# Patient Record
Sex: Male | Born: 1960
Health system: Southern US, Community
[De-identification: ages and names within clinical notes are randomized; demographics above are authoritative.]

---

## 2011-07-24 LAB — COMPREHENSIVE METABOLIC PANEL
Albumin: 3.8 g/dL (ref 3.4–5.0)
Alkaline Phosphatase: 77 U/L (ref 50–136)
Anion Gap: 11 (ref 7–16)
BUN: 10 mg/dL (ref 7–18)
Bilirubin,Total: 0.5 mg/dL (ref 0.2–1.0)
Co2: 27 mmol/L (ref 21–32)
Creatinine: 1.3 mg/dL (ref 0.60–1.30)
EGFR (African American): >60
EGFR (Non-African Amer.): >60
Osmolality: 278 (ref 275–301)
Potassium: 3.9 mmol/L (ref 3.5–5.1)
SGOT(AST): 29 U/L (ref 15–37)
SGPT (ALT): 24 U/L
Total Protein: 7.3 g/dL (ref 6.4–8.2)

## 2011-07-24 LAB — CBC
HCT: 44.3 % (ref 40.0–52.0)
HGB: 15.4 g/dL (ref 13.0–18.0)
MCH: 32.8 pg (ref 26.0–34.0)
MCHC: 34.7 g/dL (ref 32.0–36.0)
MCV: 94 fL (ref 80–100)
Platelet: 183 10*3/uL (ref 150–440)
RBC: 4.69 10*6/uL (ref 4.40–5.90)
RDW: 13.1 % (ref 11.5–14.5)
WBC: 9.8 10*3/uL (ref 3.8–10.6)

## 2011-07-24 LAB — SALICYLATE LEVEL: Salicylates, Serum: 3.8 mg/dL — ABNORMAL HIGH

## 2011-07-24 LAB — DRUG SCREEN, URINE
Amphetamines, Ur Screen: NEGATIVE (ref ?–1000)
Barbiturates, Ur Screen: NEGATIVE (ref ?–200)
Cocaine Metabolite,Ur ~~LOC~~: NEGATIVE (ref ?–300)
MDMA (Ecstasy)Ur Screen: NEGATIVE (ref ?–500)
Methadone, Ur Screen: NEGATIVE (ref ?–300)
Opiate, Ur Screen: NEGATIVE (ref ?–300)
Phencyclidine (PCP) Ur S: NEGATIVE (ref ?–25)

## 2011-07-24 LAB — ACETAMINOPHEN LEVEL: Acetaminophen: <2 ug/mL

## 2011-07-24 LAB — TSH: Thyroid Stimulating Horm: 1.12 u[IU]/mL

## 2011-07-24 LAB — ETHANOL
Ethanol %: <0.003 % (ref 0.000–0.080)
Ethanol: <3 mg/dL

## 2011-07-25 ENCOUNTER — Inpatient Hospital Stay: Payer: Self-pay | Admitting: Internal Medicine

## 2011-07-25 LAB — URINALYSIS, COMPLETE
Bacteria: NONE SEEN
Bilirubin,UR: NEGATIVE
Leukocyte Esterase: NEGATIVE
Nitrite: NEGATIVE
WBC UR: NONE SEEN /HPF (ref 0–5)

## 2011-07-26 ENCOUNTER — Inpatient Hospital Stay: Payer: Self-pay | Admitting: Psychiatry

## 2011-07-26 LAB — CBC WITH DIFFERENTIAL/PLATELET
Eosinophil #: 0.3 10*3/uL (ref 0.0–0.7)
Eosinophil %: 5 %
HGB: 14 g/dL (ref 13.0–18.0)
Lymphocyte #: 1.8 10*3/uL (ref 1.0–3.6)
Lymphocyte %: 29 %
MCHC: 33.7 g/dL (ref 32.0–36.0)
MCV: 96 fL (ref 80–100)
Monocyte #: 0.6 10*3/uL (ref 0.0–0.7)
Neutrophil %: 54.9 %
Platelet: 170 10*3/uL (ref 150–440)
RBC: 4.33 10*6/uL — ABNORMAL LOW (ref 4.40–5.90)
RDW: 13.4 % (ref 11.5–14.5)
WBC: 6.1 10*3/uL (ref 3.8–10.6)

## 2011-07-26 LAB — MAGNESIUM: Magnesium: 1.9 mg/dL

## 2011-07-26 LAB — BASIC METABOLIC PANEL
Anion Gap: 8 (ref 7–16)
Calcium, Total: 8.7 mg/dL (ref 8.5–10.1)
Co2: 25 mmol/L (ref 21–32)
Creatinine: 1.08 mg/dL (ref 0.60–1.30)
EGFR (African American): >60
EGFR (Non-African Amer.): >60
Glucose: 99 mg/dL (ref 65–99)
Osmolality: 280 (ref 275–301)
Sodium: 141 mmol/L (ref 136–145)

## 2011-07-26 LAB — LIPID PANEL
Cholesterol: 167 mg/dL (ref 0–200)
Ldl Cholesterol, Calc: 110 mg/dL — ABNORMAL HIGH (ref 0–100)
Triglycerides: 96 mg/dL (ref 0–200)

## 2011-07-31 LAB — COMPREHENSIVE METABOLIC PANEL
Bilirubin,Total: 0.6 mg/dL (ref 0.2–1.0)
Calcium, Total: 8.9 mg/dL (ref 8.5–10.1)
Co2: 31 mmol/L (ref 21–32)
Creatinine: 1.11 mg/dL (ref 0.60–1.30)
EGFR (African American): >60
Glucose: 115 mg/dL — ABNORMAL HIGH (ref 65–99)
SGOT(AST): 21 U/L (ref 15–37)
SGPT (ALT): 21 U/L
Total Protein: 6.8 g/dL (ref 6.4–8.2)

## 2011-07-31 LAB — VALPROIC ACID LEVEL: Valproic Acid: 56 ug/mL

## 2012-07-08 IMAGING — CR PELVIS - 1-2 VIEW
1 series · 2 of 2 positions shown · non-contrast
Comparison: none

REASON FOR EXAM: r/o needle sewing needle in scrotum.. Soft tissue or
center low to r/o FB
COMMENTS:   LMP: (Male)

[Series 1: ap · 0.17mm/px · 2 of 2 slices shown]
[im 1/2]
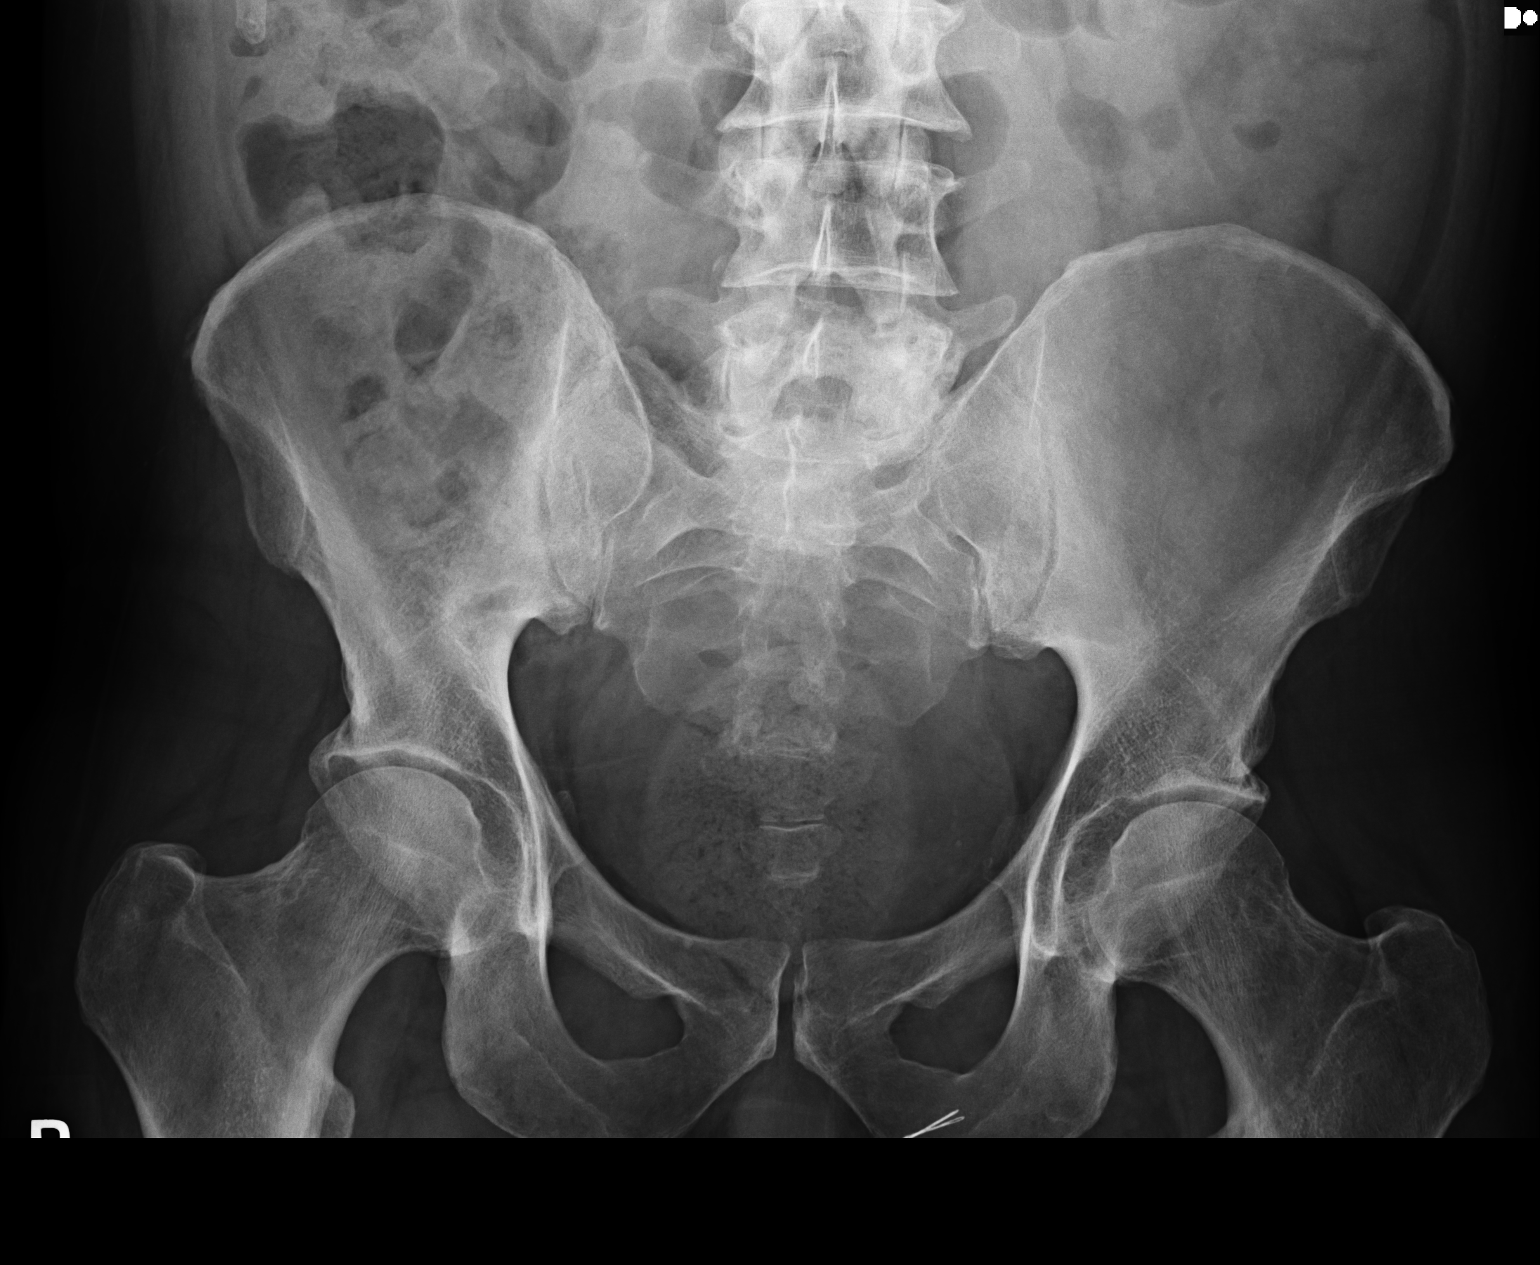
[im 2/2]
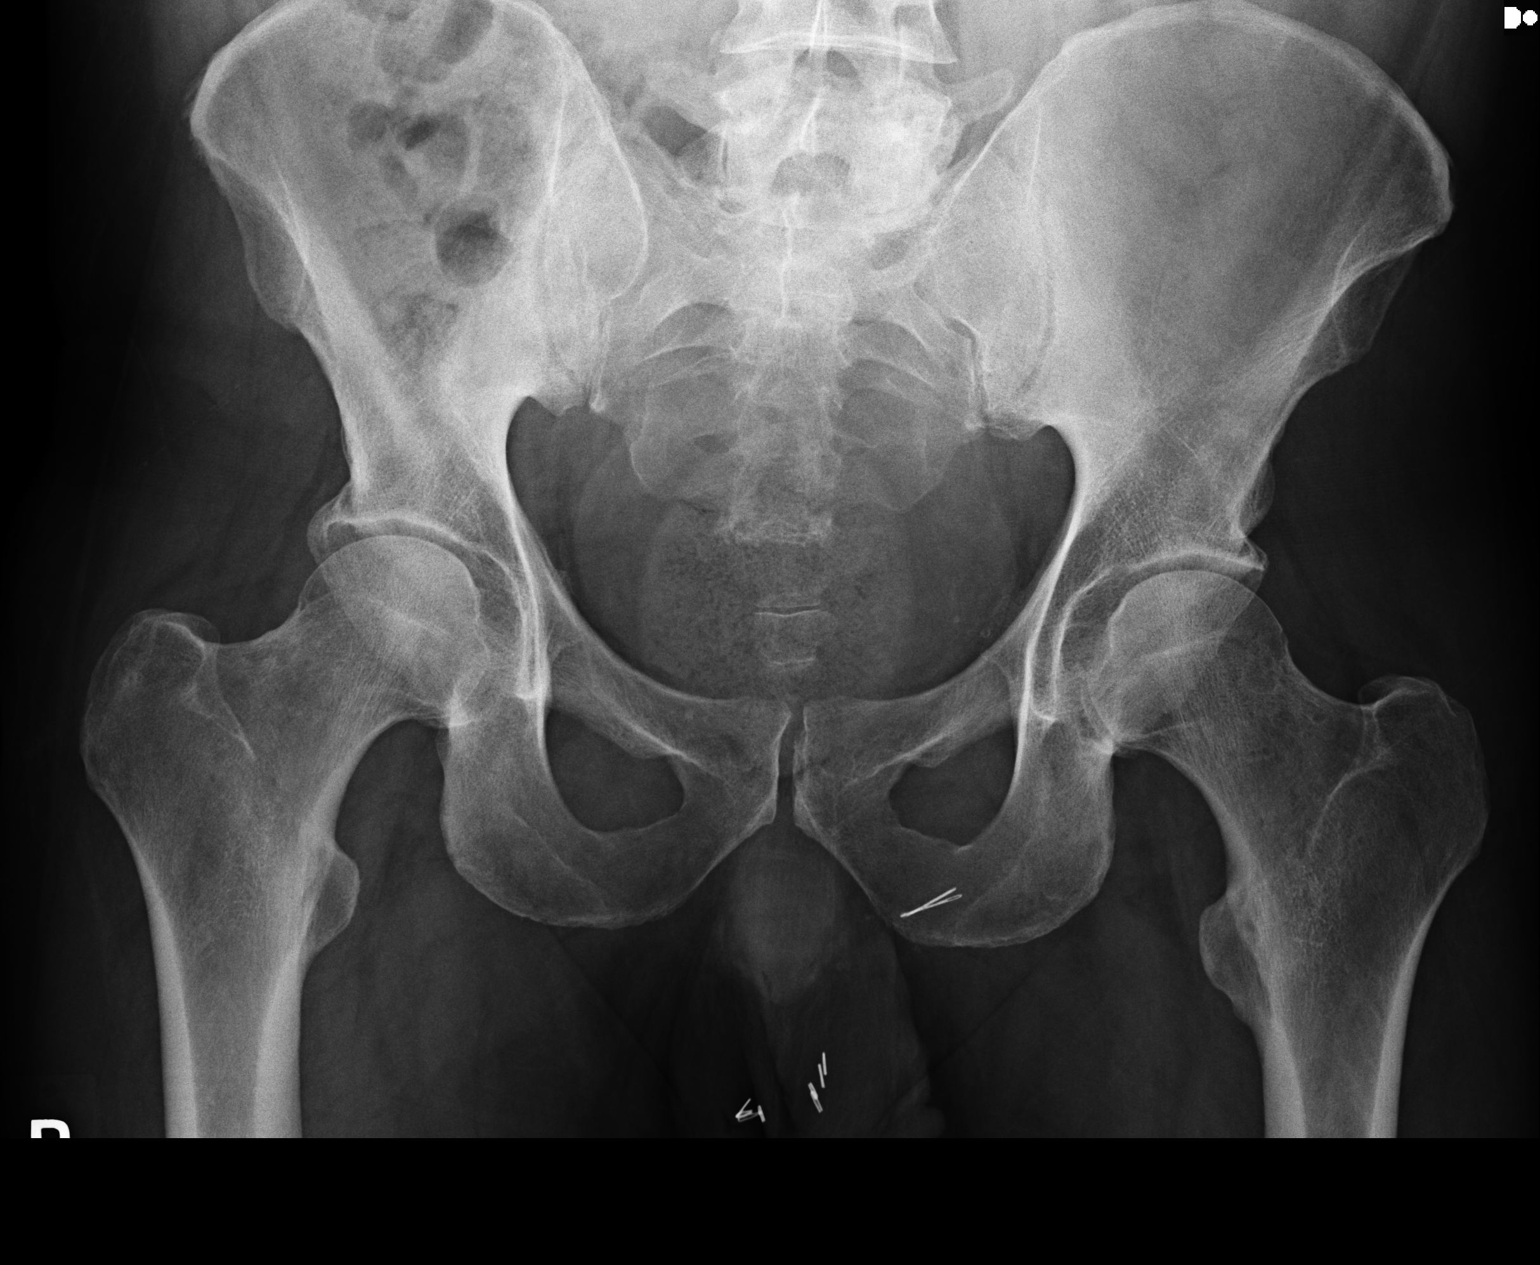

[2 of 2 positions shown; findings below may reference images not displayed]

PROCEDURE:     DXR - DXR PELVIS AP ONLY  - July 25, 2011  [DATE]

RESULT:     An AP view of the bony pelvis shows no fracture, dislocation or
other acute bony abnormality. There is what appears to be two needle
fragments or a single bent needle projected over the left ischium. Inferior
to the ischial rami, there are noted multiple linear metallic densities
consistent with multiple needle fragments projected over the scrotal region
with there being a least three on the left and two or three on the right.
IMPRESSION: 1.  There are multiple needle fragments observed in the soft tissues as
noted above.
2.  No acute bony abnormalities are seen.

## 2014-08-22 NOTE — H&P (Signed)
PATIENT NAME:  [name], [name] MR#:  [id number] DATE OF BIRTH:  1960/06/10  DATE OF ADMISSION:  07/25/2011  PRIMARY CARE PHYSICIAN: He states his name is Dr. Iran Sizer.   CHIEF COMPLAINT: Drug overdose.   HISTORY OF PRESENT ILLNESS: [name] is a 54 year old Caucasian male with a history of schizophrenia. He was arrested by the Police when they found him driving his car in an erratic way and suspected drug overdose. He was brought to the hospital. Here he admits that he tried to commit suicide by taking 5 tablets of fluphenazine. The patient does not state much about why he did that. He is groggy and sleepy, and it was difficult to obtain history. He has with him papers of involuntary commitment. The patient was admitted to the Intensive Care Unit for further evaluation and treatment. Contacting the [facility], they recommended checking his vital signs and to keep an eye on his EKG.   REVIEW OF SYSTEMS: Ten-point system review was difficult to obtain due to the patient's drowsiness, and he does not give adequate history; but generally he denies having any discomfort. No chest pain, no shortness of breath.  No abdominal pain. No headache.   PAST MEDICAL HISTORY:  1. History of schizophrenia.  2. He also gave vague statements about a history of heart problem for which he takes a pill, but he does not recall the name, in addition to one aspirin a day.   SOCIAL HABITS: Chronic smoker, 1-1/2  packs per day since the age of 45. He drinks alcohol only occasionally or socially, primarily beer. No other drug abuse.   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 He lives with his mother.   FAMILY HISTORY: Negative for mental illness.   ADMISSION MEDICATIONS:  1. Fluphenazine, the dose is not specified.  2. Aspirin 325 mg a day.  3. He reports also a heart pill that he does not verbalize or recall the name adequately.   ALLERGIES: No known drug allergies.    PHYSICAL EXAMINATION:  VITAL SIGNS: Blood pressure 99/58, respiratory rate 16, pulse 72, temperature 98.4, oxygen saturation 98%.   GENERAL APPEARANCE: Middle-aged male lying in bed, looks sleepy but arousable, in no acute distress.   HEENT: Head: No pallor. No icterus. No cyanosis. Ears, nose and throat: Hearing was normal. Nasal mucosa, lips, tongue were normal. Eyes: Examination revealed normal iris and conjunctivae, although the conjunctivae are slightly congested. Pupils are about 6 to 7 mm, equal and reactive to light.   NECK: Supple. Trachea at midline. No thyromegaly. No cervical lymphadenopathy. No masses.   HEART: Exam revealed normal S1, S2. No S3, S4. No murmur. No gallop. No carotid bruits.   RESPIRATORY: Exam revealed normal breathing pattern without use of accessory muscles. No rales. No wheezing.   ABDOMEN: Soft without tenderness. No hepatosplenomegaly. No masses. No hernias.   MUSCULOSKELETAL: No joint swelling. No clubbing.   SKIN: No ulcers. No subcutaneous nodules.   NEUROLOGICAL: Cranial nerves II through XII are intact. No focal motor deficit.   PSYCHIATRY: The patient is alert, oriented to place being the hospital in [place], but he could not recall the name. He is oriented to the date and the month. Mood and affect were flat.   LABORATORY, DIAGNOSTIC AND RADIOLOGICAL DATA:  His EKG showed normal sinus rhythm at a rate of 86 per minute. Normal EKG. Serum glucose 95, BUN 10, creatinine 1.3, sodium 140, potassium 3.9.  Alcohol less than 3, and  liver function tests were normal.  His TSH was normal at 1.1.  CBC was normal with white count of 9000, hemoglobin 15, hematocrit 44, platelet count 183. Acetaminophen less than 2.0  Salicylate was 3.8. This is still in the therapeutic range.  Urine drug screen was negative.   ASSESSMENT:  1. Drug overdose with fluphenazine. He took 5 tablets. 2. Suicide attempt, according to the  patient. 3. Schizophrenia. 4. History of nonspecified heart problem.   PLAN:  1. The patient was admitted to the Intensive Care Unit  with monitoring.  2. Repeat EKG in the morning to make sure there are no changes.  3. We will keep an eye on his vital signs.  4. Psychiatric evaluation and consult.   ____________________________ Carney CornersAmir M. Rudene Rearwish, MD amd:cbb D: 07/25/2011 02:40:02 ET T: 07/25/2011 08:12:48 ET JOB#: [id number]  cc: Carney CornersAmir M. Rudene Rearwish, MD, <Dictator> Dr. Lesia SagoNoble Shakevia Sarris M Randalyn Ahmed MD ELECTRONICALLY SIGNED 07/26/2011 0:18

## 2014-08-22 NOTE — Consult Note (Signed)
PATIENT NAME:  [name], Domani MR#:  [id number] DATE OF BIRTH:  Apr 11, 1961  DATE OF CONSULTATION:  07/25/2011  REFERRING PHYSICIAN:  Dr. Marlaine HindAmir Darwish  CONSULTING PHYSICIAN:  Doralee AlbinoAarti K. Maryruth BunKapur, MD  REASON FOR ADMISSION: Suicidal thoughts status post overdose on a combination of Lamictal and Prolixin.   IDENTIFYING INFORMATION: Mr. [name] is a 54 year old single Caucasian male currently working at [employer] 12 hours a week and living in [place] with his mother. He has never been married and has no children.   HISTORY OF PRESENT ILLNESS: Mr. [name] is a 54 year old single Caucasian male with a prior diagnosis of schizophrenia per the patient who was brought to the hospital by the police after he was arrested for driving erratically. The patient had told police that he took five Prolixin pills in a suicide attempt but then told this writer he had also overdosed on Lamictal. In addition, the patient said he had had a 6-pack of beer prior to taking the pills although ethanol level in the Emergency Room was negative and toxicology screen was negative for all substances. The patient says he was just discharged from [facility] a few weeks ago after having had a suicide attempt there by trying to tie a hose in his car. When asked why he was trying to commit suicide patient said, "I don't know." He says that he was driving from his home in [place] to [place]. When asked why he said, "I don't know." He then said he was driving back from [place] to [place]. The only stressor the patient could identify was that he is unhappy with his current living situation with his mother. He describes his mother as being a very negative person who puts him in a bad mood repeatedly. He says his mother is hurting him by the statement that she makes. He currently hates this living situation and says he is "tired of living". He denies any current active suicidal thoughts. The patient denies feeling depressed prior to the suicide  attempt and denies feelings of helplessness and hopelessness, anhedonia, decreased energy level, problems with insomnia or change in appetite. He says his appetite is good and the patient does not appear to be underweight. BMI is 24.4. The patient denies any illicit drug use. He says he only drinks alcohol, one 24-ounce beer every other day. He denies any history of any auditory or visual hallucinations. No paranoid thoughts but the patient does have some delusions. He believes that he is telepathic and that he can have sex with women without touching them. He also believes that he can communicate and talk with dogs and animals and believes that they talk back to him. He denies any ideas of reference from the TV or from music. He does state that he writes songs quite frequently but does not endorse any grandiose delusions, hyperreligious thoughts or periods of time with decreased sleep for several days at a time with increased goal-directed behavior. CT of the head in the Emergency Room was negative. Toxicology screen was negative for all substances and ethanol screen was less than 3. The patient says he is currently on Prolixin since the 1980s and was prescribed Lamictal at [facility] a few weeks ago. He does not know the dosage. He also says he is on Cogentin.   PAST PSYCHIATRIC HISTORY: Patient reports multiple prior inpatient psychiatric hospitalizations beginning at the age of 54 when he was diagnosed with schizophrenia. He says he was hospitalized at [facility] for about a six-month period,  [facility], [facility]. He has not ever been hospitalized at [facility]. He does report two suicide attempts in the past, one by trying to put a hose in his car and another by cutting his wrist. He says he is followed by [facility], Dorena Dew, and his last appointment was two weeks ago. He reports being on Prolixin, Lamictal and Cogentin but denies being on any other psychotropic medications  in the past. He says he goes to [facility] in [place] to get his medications.   SUBSTANCE ABUSE HISTORY: He does report a history of heavy alcohol use approximately one year ago stating that he was drinking a 12-pack of beer on a daily basis but then cut that down one year ago. He says he was drinking heavily for about three years. He tried marijuana in high school but has not used any since. He denies any cocaine, opiate, or stimulant use. He denies any prescription narcotic abuse. He does smoke 1-1/2 packs of cigarettes per day and has been smoking since the age of 89.   FAMILY PSYCHIATRIC HISTORY: The patient reports that his sister sees a  psychiatrist but he does not know why. He believes his mother has problems with nerves and is a "negative person".  PAST MEDICAL HISTORY:  1. He does report a history of an irregular heart rate. 2. History of hernia surgery.  3. History of tonsillectomy.  4. He denies any history of any TBI or seizures.  PRIMARY CARE PHYSICIAN: [facility].   OUTPATIENT MEDICATIONS:  1. Metoprolol, unknown dosage. 2. Prolixin, unknown dosage.  3. Cogentin, unknown dosage.  4. Aspirin 81 mg p.o. daily.   ALLERGIES: No known drug allergies.   SOCIAL HISTORY: Patient has been raised in [place] since 1972. He says his parents separated when he was 52 years old and his father has now passed. He lives with his mother in the [place] area for the past one year. He has never been married and does not have a girlfriend. He last broke up with his girlfriend in July 2012. He denies any history of any physical or sexual abuse. 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 He has been working 12 hours a week for the past four months at [employer] and prior to that was working at [employer] for about a one month period. He says he currently gets food stamps but no disability  or SSI.   LEGAL HISTORY: Patient does report a history of a DUI in the 60s.   MENTAL STATUS EXAM: Mr. Slovacek is a 54 year old Caucasian male with medium Teuscher hair. He was lying in his hospital bed in the Critical Care Unit and did not appear to be in any acute distress. He was fully alert and oriented to time, place, and situation although this events prior to hospitalization were somewhat vague and unclear. Speech was regular rate and rhythm, fluent and coherent. Mood was described as being "okay" and affect was full and congruent, somewhat inappropriate for describing suicide attempt. He denied any current active suicidal thoughts but did endorse some passive suicidal thoughts prior to the overdose. He denied any current homicidal thoughts or auditory or visual hallucinations. He was clearly having some delusional thoughts about being telepathic but no paranoid thoughts. Attention and concentration were fairly good. Recall was three out of three initially and three out  of three after five minutes. He could name the presidents backwards to [place]. He could do serial threes from 20 but had a difficult time doing serial sevens. He was unable to spell world backwards correctly. Abstraction was concrete.   SUICIDE RISK ASSESSMENT: At this time Mr. Near remains at a moderately elevated risk of harm to self and others secondary to two recent suicide attempts within the past 1 to 2 months. In addition, he is expressing some delusional beliefs. He denies any intent to harm anyone else or to harm himself at this time and is willing to undergo further inpatient psychiatric treatment. He denies any access to guns.    REVIEW OF SYSTEMS: CONSTITUTIONAL: He denies any weakness, fatigue or weight changes. He denies any fever, chills, or night sweats. HEAD: He denies any headaches or dizziness. EYES: He denies any diplopia or blurred vision. ENT: He denies any hearing loss. RESPIRATORY: He denies any shortness breath  or cough. CARDIOVASCULAR: He denies chest pain or orthopnea. GASTROINTESTINAL: He denies any nausea, vomiting, or abdominal pain. He denies any change in bowel movements. GENITOURINARY: He denies incontinence or problems with frequency of urine but of note he did insert a sewing needle into his penis in the 1970s that is still present. The patient. ENDOCRINE: He denies any heat or cold intolerance. LYMPHATIC: He denies any anemia or bruising. MUSCULOSKELETAL: No muscle or joint pain. NEUROLOGIC: He denies any tingling or weakness. PSYCHIATRIC: Please see history of present illness.   PHYSICAL EXAMINATION:  VITAL SIGNS: Blood pressure 139/70, heart rate 96, respirations 30, temperature 98.5, pulse ox 100% on room air. Please see initial physical exam as completed by Dr. Marlaine Hind.   LABORATORY, DIAGNOSTIC, AND RADIOLOGICAL DATA: Complete metabolic panel within normal limits. TSH and CBC within normal limits. Acetaminophen level less than 2. Salicylates 3.8. Toxicology screen was negative for all substances and ethanol level was less than 3. Blood glucose 112. CT of the head was negative for any acute process. Chest x-ray showed no significant abnormalities.   DIAGNOSES:  AXIS I:  1. Schizophrenia. 2. History of alcohol dependence.  3. Nicotine dependence.  4. History of cannabis abuse, in full remission.   AXIS II: Deferred.   AXIS III:  1. Irregular heart rate.  2. History of hernia surgery.  3. History of tonsillectomy.  4. Patient reports inserting a needle in his penis in the 1970s.   AXIS IV: Severe-Financial problems, housing problems, history of legal problems.   AXIS V: GAF at present equals 30.   ASSESSMENT AND TREATMENT RECOMMENDATIONS: Mr. Perine is a 54 year old single Caucasian male with a prior diagnosis of schizophrenia who presented to the Emergency Room after having overdosed on Prolixin and Lamictal. He reports an intentional suicide attempt and says he was "tired of  living." He is also expressing some delusional thoughts believing that he is telepathic. No auditory or visual hallucinations were present and he did not appear to be responding to internal stimuli. Once medically cleared will plan to admit to inpatient psychiatry for medication management, safety, and stabilization.  1. Schizophrenia. At this time will hold all psychotropic medications until overdose is cleared from his system. EKG did not show any prolongation of QTc. Will check B12 and folate level in a.m. Will try to get old records from [facility] regarding prior medication trials and will look at considering starting atypical antipsychotic such as Risperdal in the a.m. Will also try to gain collateral information from the patient's mother regarding past  psychotropic medication trials and try to reach his outpatient psychiatrist at [facility]. 2. Irregular heartbeat. Will defer to advice from the hospitalist medicine service as to when to restart metoprolol. Will need to get correct metoprolol dosage from [facility] at [facility].  3. Disposition. Will need to contact the patient's mother with regards to whether or not he can return to live with her. Psychotropic medication management follow-up appointment will most likely be at [facility] where the patient has been followed in the past. Risks, benefits, and alternatives to treatment were discussed with the patient. He is currently under an involuntary commitment and will leave under and involuntary commitment at this time. While in the Critical Care Unit patient does have a one-to-one sitter as well.   ____________________________ Doralee Albino. Maryruth Bun, MD akk:cms D: 07/25/2011 11:15:49 ET T: 07/25/2011 11:41:02 ET JOB#: [id number]  cc: Aarti K. Maryruth Bun, MD, <Dictator> Darliss Ridgel MD ELECTRONICALLY SIGNED 07/29/2011 17:51

## 2014-08-22 NOTE — Discharge Summary (Signed)
PATIENT NAME:  [name], [name] MR#:  [id number] DATE OF BIRTH:  05/25/1960  DATE OF ADMISSION:  07/25/2011 DATE OF DISCHARGE: 07/26/2011  PRIMARY CARE PHYSICIAN: Dr. Iran Sizer  PRIMARY PSYCHIATRIST: Dr. Shawnie Dapper  REASON FOR ADMISSION: Drug overdose in a suicide attempt with resultant altered mental status.   DISCHARGE DIAGNOSES:  1. Drug overdose with fluphenazine and Lamictal in a suicide attempt. 2. Altered mental status and hypotension (drug induced), resolved.  3. History of irregular heartbeat. 4. History of schizophrenia. 5. History of tobacco abuse.  6. Previous history of suicide attempts. 7. History of alcohol abuse. 8. The patient was inserting sewing needles into his scrotum.  CONSULTANTS: Caryn Section, MD - Psychiatry.  DISCHARGE DISPOSITION: [facility] Behavior Medicine unit.   DISCHARGE MEDICATIONS:  1. Risperdal 2 mg p.o. at bedtime.  2. Ativan per CIWA protocol, to be further managed by Dr. Maryruth Bun.  3. Aspirin 325 mg p.o. daily. 4. Metoprolol succinate 25 mg p.o. daily.   DISCHARGE CONDITION: Improved, currently hemodynamically and medically stable but needs further psychiatric stabilization.   DISCHARGE ACTIVITY: As tolerated, but the patient is to be on suicide precautions for now as recommended by Dr. Caryn Section and has a one-to-one sitter currently and is under involuntary commitment.   DISCHARGE DIET: Low sodium.  DISCHARGE INSTRUCTIONS: 1. Take medications as prescribed. 2. Return to the emergency department for recurrence of symptoms and for suicidal ideation and drug overdose.   FOLLOWUP INSTRUCTIONS: 1. Followup with Dr. Caryn Section upon arrival to [facility] Behavior Medicine unit.  2. Followup with your primary psychiatrist, Dr. Shawnie Dapper, within 1 to 2 weeks at [facility] after discharge from [facility].  3. Followup with your primary care physician, Dr. Iran Sizer, within 1 to 2 weeks  upon hospital discharge from [facility] inpatient Behavior Medicine unit.   PROCEDURES: Noncontrast head CT on 07/24/2011: No acute intracranial abnormalities are noted.   Portable chest x-ray on 07/25/2011: No acute cardiopulmonary abnormalities are noted.   Pelvic x-ray on 07/25/2011: Multiple needle fragments are observed in the soft tissues over the scrotal region with there being at least three on the left and two or three on the right. No acute bony abnormalities are seen.   PERTINENT LABS/STUDIES: EKG on admission: Normal sinus rhythm, heart rate 86 beats per minute without acute ST or T wave changes. Unremarkable EKG.  Serum salicylates 3.0 on admission. Serum acetaminophen level less than 2. Serum alcohol level less than 003%. Urine drug screen was negative. TSH 1.12. CMP was normal on admission. CBC normal on admission.   Urinalysis was benign.   Vitamin B12 levels are within normal limits.   BRIEF HISTORY/HOSPITAL COURSE: The patient is a 54 year old male with past medical history of unspecified heart abnormality and possible irregular heart beat, history of schizophrenia, tobacco abuse, and prior suicide attempt who was brought to the ER with altered mental status after a drug overdose. Please see dictated admission history and physical for pertinent details surrounding the onset of this hospitalization. Please see below for further details.  1. Altered mental status and drug overdose in a suicide attempt - the patient was arrested by the [employer] when they found him driving his car in an erratic manner. Thereafter he was brought to the ER for further evaluation. He was somewhat sedated and had altered mental status initially. He did admit to taking some fluphenazine and Lamictal, but the exact strength and number of pills  as well as exact time and duration was unclear. He underwent a noncontrast head CT in the ER which was negative for acute intracranial  abnormalities. Thereafter, he was admitted to the medical service and placed on supportive measures and close monitoring. Involuntary commitment papers were also filed in the ER and he was admitted to the medical service under suicide precautions with one-to-one sitter always present. Initial labs were unremarkable. Urine drug screen was negative, serum acetaminophen level was not elevated, and serum alcohol level was less than 003%. Salicylate level was in the therapeutic/nontoxic range which was likely from aspirin use, which he was prescribed as an outpatient. He denied any alcohol ingestion. With supportive measures and allowing medications to adequately clear from his system, his mental status has returned to baseline. He did admit to feeling suicidal, but he stated he had no exact trigger as to why he wanted to commit suicide, but he denied depression. He was noted to have some transient hypotension, which was felt to be medication related. Initially his metoprolol was held and he was admitted to the Critical Care Unit initially and placed on IV fluids for blood pressure support, but did not require any vasopressors. Once fluphenazine and Lamictal had cleared from his system, and with IV fluids his blood pressure stabilized. On hospital day two his blood pressure started to rise including his heart rate. We held his metoprolol and thereafter his metoprolol was restarted with good control of his blood pressure and heart rate thereafter. As far as his drug overdose and suicide attempt, a psychiatry consultation was obtained and Dr. Maryruth BunKapur recommended admitting the patient to the inpatient Behavior Medicine unit at [facility] once he was deemed medically stable for further psychiatric stabilization. She recommended discontinuing fluphenazine for now and also holding Lamictal and the patient will be started on Risperdal instead, per Dr. Marciano SequinAarti Kapur's recommendation.  The patient had mild hyperglycemia which was felt to  be stress induced and there is no  prior history of diabetes mellitus and no evidence of diabetes mellitus currently with Hemoglobin A1c of 6.  2. History of irregular heartbeat - this was not detected during this hospitalization and EKG was unremarkable and revealed normal sinus rhythm, as did continuous telemetry monitoring while he was in the critical care unit. There are no arrhythmias noted whatsoever. For his history of irregular heartbeat, he will be maintained on metoprolol therapy as well as aspirin, as previously advised by his primary care physician, and he is tolerating both these agents well at the time of discharge.  3. Tobacco abuse - the patient was strongly counseled on the importance of smoking cessation.  4. Alcohol abuse - Dr. Maryruth BunKapur recommended keeping the patient on CIWA protocol, but on the medical floor he did not experience any signs or symptoms of alcohol withdrawal or DTs. He was counseled on the importance of abstinence from alcohol ingestion in the future once he has been successfully detoxed, which will be further managed by Dr. Caryn SectionAarti Kapur.   On 07/26/2011, the patient was hemodynamically and medically stable and felt to be stable for discharge to [facility] inpatient psychiatric behavioral medical unit under the care of Dr. Maryruth BunKapur.  TIME SPENT ON DISCHARGE: Greater than 30 minutes. ____________________________ Elon AlasKamran N. Teosha Casso, MD knl:slb D: 07/30/2011 21:21:00 ET T: 07/31/2011 14:04:23 ET JOB#: [id number]  cc: Elon AlasKamran N. Jhace Fennell, MD, <Dictator> Dr. Iran SizerNoble - PCP  Dr. Shawnie DapperLopez - Psychiatry Scotty CourtKAMRAN N Quindell Shere MD ELECTRONICALLY SIGNED 08/08/2011 22:02

## 2014-09-07 NOTE — H&P (Signed)
[name], [name] [id number] OF BIRTH:  October 05, 1960 OF ADMISSION:  07/26/2011 PHYSICIAN:  Dr. Marlaine Hind PHYSICIAN:  Doralee Albino. Maryruth Bun, MD  REASON FOR ADMISSION: Suicidal thoughts status post overdose on a combination of Lamictal and Prolixin.  INFORMATION: Mr. [name] is a 54 year old single Caucasian male currently working at [employer] 12 hours a week and living in [place] with his mother. He has never been married and has no children.  OF PRESENT ILLNESS: Mr. [name] is a 54 year old single Caucasian male with a prior diagnosis of schizophrenia per the patient who was brought to the hospital by the police after he was arrested for driving erratically. The patient had told police that he took five Prolixin pills in a suicide attempt but then told this writer he had also overdosed on Lamictal. In addition, the patient said he had had a 6-pack of beer prior to taking the pills although ethanol level in the Emergency Room was negative and toxicology screen was negative for all substances. The patient says he was just discharged from [facility] a few weeks ago after having had a suicide attempt there by trying to tie a hose in his car. When asked why he was trying to commit suicide patient said, "I don't know." He says that he was driving from his home in [place] to [place]. When asked why he said, "I don't know." He then said he was driving back from [place] to [place]. The only stressor the patient could identify was that he is unhappy with his current living situation with his mother. He describes his mother as being a very negative person who puts him in a bad mood repeatedly. He says his mother is hurting him by the statement that she makes. He currently hates this living situation and says he is "tired of living". He denies any current active suicidal thoughts. The patient denies feeling depressed prior to the suicide attempt and denies feelings of helplessness and hopelessness, anhedonia,  decreased energy level, problems with insomnia or change in appetite. He says his appetite is good and the patient does not appear to be underweight. BMI is 24.4. The patient denies any illicit drug use. He says he only drinks alcohol, one 24-ounce beer every other day. He denies any history of any auditory or visual hallucinations. No paranoid thoughts but the patient does have some delusions. He believes that he is telepathic and that he can have sex with women without touching them. He also believes that he can communicate and talk with dogs and animals and believes that they talk back to him. He denies any ideas of reference from the TV or from music. He does state that he writes songs quite frequently but does not endorse any grandiose delusions, hyperreligious thoughts or periods of time with decreased sleep for several days at a time with increased goal-directed behavior. CT of the head in the Emergency Room was negative. Toxicology screen was negative for all substances and ethanol screen was less than 3. The patient says he is currently on Prolixin since the 1980s and was prescribed Lamictal at [facility] a few weeks ago. He does not know the dosage. He also says he is on Cogentin.  PSYCHIATRIC HISTORY: Patient reports multiple prior inpatient psychiatric hospitalizations beginning at the age of 7 when he was diagnosed with schizophrenia. He says he was hospitalized at [facility] for about a six-month period, [place], [facility] and [place]. He has not ever been hospitalized at [facility]. He does  report two suicide attempts in the past, one by trying to put a hose in his car and another by cutting his wrist. He says he is followed by [facility], Dorena DewCarlos Lopez, and his last appointment was two weeks ago. He reports being on Prolixin, Lamictal and Cogentin but denies being on any other psychotropic medications in the past. He says he goes to [facility] in [place] to get his  medications.  ABUSE HISTORY: He does report a history of heavy alcohol use approximately one year ago stating that he was drinking a 12-pack of beer on a daily basis but then cut that down one year ago. He says he was drinking heavily for about three years. He tried marijuana in high school but has not used any since. He denies any cocaine, opiate, or stimulant use. He denies any prescription narcotic abuse. He does smoke 1-1/2 packs of cigarettes per day and has been smoking since the age of 54.  PSYCHIATRIC HISTORY: The patient reports that his sister sees a  psychiatrist but he does not know why. He believes his mother has problems with nerves and is a "negative person". MEDICAL HISTORY:  1. He does report a history of an irregular heart rate. 2. History of hernia surgery.  History of tonsillectomy.  He denies any history of any TBI or seizures. CARE PHYSICIAN: [facility].  MEDICATIONS:  1. Metoprolol, unknown dosage. 2. Prolixin, unknown dosage.  Cogentin, unknown dosage.  Aspirin 81 mg p.o. daily.   ALLERGIES: No known drug allergies.  HISTORY: Patient has been raised in [place]. He says his parents separated when he was 89 years old and his father has now passed. He lives with his mother in the [place] area for the past one year. He has never been married and does not have a girlfriend. He last broke up with his girlfriend in July 2012. He denies any history of any physical or sexual abuse. 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 He has been working 12 hours a week for the past four months at [employer] and prior to that was working at [employer] for about a one month period. He says he currently gets food stamps but no disability or SSI.  HISTORY: Patient does report a history of a DUI in the 191980s.  STATUS EXAM: [name] is a 54 year old Caucasian male with medium Marovich hair. He  was lying in his hospital bed in the Critical Care Unit and did not appear to be in any acute distress. He was fully alert and oriented to time, place, and situation although this events prior to hospitalization were somewhat vague and unclear. Speech was regular rate and rhythm, fluent and coherent. Mood was described as being "okay" and affect was full and congruent, somewhat inappropriate for describing suicide attempt. He denied any current active suicidal thoughts but did endorse some passive suicidal thoughts prior to the overdose. He denied any current homicidal thoughts or auditory or visual hallucinations. He was clearly having some delusional thoughts about being telepathic but no paranoid thoughts. Attention and concentration were fairly good. Recall was three out of three initially and three out of three after five minutes. He could name the presidents backwards to [place]. He could do serial threes from 20 but had a difficult time doing serial sevens. He was unable to spell world backwards correctly.  Abstraction was concrete.  RISK ASSESSMENT: At this time [name] remains at a moderately elevated risk of harm to self and others secondary to two recent suicide attempts within the past 1 to 2 months. In addition, he is expressing some delusional beliefs. He denies any intent to harm anyone else or to harm himself at this time and is willing to undergo further inpatient psychiatric treatment. He denies any access to guns.   OF SYSTEMS: CONSTITUTIONAL: He denies any weakness, fatigue or weight changes. He denies any fever, chills, or night sweats. HEAD: He denies any headaches or dizziness. EYES: He denies any diplopia or blurred vision. ENT: He denies any hearing loss. RESPIRATORY: He denies any shortness breath or cough. CARDIOVASCULAR: He denies chest pain or orthopnea. GASTROINTESTINAL: He denies any nausea, vomiting, or abdominal pain. He denies any change in bowel movements. GENITOURINARY: He  denies incontinence or problems with frequency of urine but of note he did insert a sewing needle into his penis in the 1970s that is still present. The patient. ENDOCRINE: He denies any heat or cold intolerance. LYMPHATIC: He denies any anemia or bruising. MUSCULOSKELETAL: No muscle or joint pain. NEUROLOGIC: He denies any tingling or weakness. PSYCHIATRIC: Please see history of present illness.  EXAMINATION: SIGNS: Blood pressure 139/70, heart rate 96, respirations 30, temperature 98.5, pulse ox 100% on room air. Please see initial physical exam as completed by Dr. Marlaine HindAmir Darwish.  DIAGNOSTIC, AND RADIOLOGICAL DATA: Complete metabolic panel within normal limits. TSH and CBC within normal limits. Acetaminophen level less than 2. Salicylates 3.8. Toxicology screen was negative for all substances and ethanol level was less than 3. Blood glucose 112. CT of the head was negative for any acute process. Chest x-ray showed no significant abnormalities.  I:  1. Schizophrenia. 2. History of alcohol dependence.  3. Nicotine dependence.  4. History of cannabis abuse, in full remission.  II: Deferred.  III:  1. Irregular heart rate.  2. History of hernia surgery.  History of tonsillectomy.  Patient reports inserting a needle in his penis in the 1970s.  IV: Severe-Financial problems, housing problems, history of legal problems.  V: GAF at present equals 30.  AND TREATMENT RECOMMENDATIONS: [name] is a 54 year old single Caucasian male with a prior diagnosis of schizophrenia who presented to the Emergency Room after having overdosed on Prolixin and Lamictal. He reports an intentional suicide attempt and says he was "tired of living." He is also expressing some delusional thoughts believing that he is telepathic. No auditory or visual hallucinations were present and he did not appear to be responding to internal stimuli. Now that the patient is medically cleared will plan to admit to inpatient psychiatry for  medication management, safety, and stabilization.  Schizophrenia: Due to ongoing delusions will go ahead and start Risperdal 2mg  po nightly for psychosis with a plan to possibly transition to [place]; will also consider adding a mood stabilizer other than Lamictal; will wait and discuss with Dr. Shawnie DapperLopez his outpatient psychiatrist; his therapist Mr. Shoffner did provide some collateral information about last suicide attempt and hospitalization at [facility]; no QTc prolongation on EKG. Will check B12 and folate level in a.m.  Irregular heartbeat: VSS; Will defer to advice from the hospitalist medicine service as to when to restart metoprolol.  Disposition: Plan to transfer to Inpatient Psych once medically cleared;  Will need to contact the patient's mother with regards to whether or not he can return to live with her. Psychotropic medication management follow-up  appointment will most likely be at [facility] where the patient has been followed in the past. While in the Critical Care Unit please kep with one-to-one sitter until transfer to Psych    Electronic Signatures: Caryn Section (MD) (Signed on 28-Mar-13 10:58)  Authored   Last Updated: 28-Mar-13 10:59 by Caryn Section (MD)

## 2023-09-29 DEATH — deceased
